# Patient Record
Sex: Male | Born: 1945 | Race: Black or African American | Hispanic: No | Marital: Single
Health system: Southern US, Community
[De-identification: ages and names within clinical notes are randomized; demographics above are authoritative.]

---

## 2015-11-01 ENCOUNTER — Encounter: Payer: Self-pay | Admitting: Sports Medicine

## 2015-11-01 ENCOUNTER — Ambulatory Visit (INDEPENDENT_AMBULATORY_CARE_PROVIDER_SITE_OTHER): Payer: Medicare Other | Admitting: Sports Medicine

## 2015-11-01 DIAGNOSIS — M79674 Pain in right toe(s): Secondary | ICD-10-CM | POA: Diagnosis not present

## 2015-11-01 DIAGNOSIS — B351 Tinea unguium: Secondary | ICD-10-CM

## 2015-11-01 DIAGNOSIS — B353 Tinea pedis: Secondary | ICD-10-CM | POA: Diagnosis not present

## 2015-11-01 DIAGNOSIS — M79675 Pain in left toe(s): Secondary | ICD-10-CM

## 2015-11-01 MED ORDER — ECONAZOLE NITRATE 1 % EX CREA: TOPICAL_CREAM | Freq: Every day | CUTANEOUS | 2 refills | Status: AC

## 2015-11-01 NOTE — Progress Notes (Signed)
Subjective: Harry Martin is a 70 y.o. male patient seen today in office with complaint of painful thickened and elongated toenails; unable to trim. Patient denies history of Diabetes, Neuropathy, or Vascular disease. Patient has no other pedal complaints at this time.   There are no active problems to display for this patient.   No current outpatient prescriptions on file prior to visit.   No current facility-administered medications on file prior to visit.     Not on File  Objective: Physical Exam  General: Well developed, nourished, no acute distress, awake, alert and oriented x 3  Vascular: Dorsalis pedis artery 2/4 bilateral, Posterior tibial artery 1/4 bilateral, skin temperature warm to warm proximal to distal bilateral lower extremities, no varicosities, pedal hair present bilateral.  Neurological: Gross sensation present via light touch bilateral.   Dermatological: Skin is warm, dry, and supple bilateral, Nails 1-10 are tender, long, thick, and discolored with mild subungal debris, no webspace macerations present bilateral, no open lesions present bilateral, no callus/corns/hyperkeratotic tissue present bilateral. Diffuse dry scaly skin plantarly suggestive of tinea. No signs of infection bilateral.  Musculoskeletal: No symptomatic boney deformities noted bilateral. Muscular strength within normal limits without painon range of motion. No pain with calf compression bilateral.  Assessment and Plan:  Problem List Items Addressed This Visit    None    Visit Diagnoses    Tinea pedis of both feet    -  Primary   Relevant Medications   econazole nitrate 1 % cream   Dermatophytosis of nail       Relevant Medications   econazole nitrate 1 % cream   Toe pain, bilateral         -Examined patient.  -Discussed treatment options for painful mycotic nails. -Mechanically debrided and reduced mycotic nails with sterile nail nipper and dremel nail file without incident. -Rx  Econazole cream to use daily  -Patient to return in 3 months for follow up evaluation or sooner if symptoms worsen.  Asencion Islamitorya Kirsti Mcalpine, DPM

## 2015-11-29 ENCOUNTER — Other Ambulatory Visit: Payer: Self-pay | Admitting: Neurology

## 2015-11-29 DIAGNOSIS — R410 Disorientation, unspecified: Secondary | ICD-10-CM

## 2015-11-29 DIAGNOSIS — R413 Other amnesia: Secondary | ICD-10-CM

## 2015-12-10 ENCOUNTER — Ambulatory Visit: Payer: Self-pay

## 2015-12-12 ENCOUNTER — Ambulatory Visit: Admission: RE | Admit: 2015-12-12 | Payer: Medicare Other | Source: Ambulatory Visit

## 2015-12-20 ENCOUNTER — Other Ambulatory Visit: Payer: Self-pay | Admitting: Neurology

## 2015-12-20 DIAGNOSIS — Z0189 Encounter for other specified special examinations: Secondary | ICD-10-CM

## 2015-12-20 DIAGNOSIS — Z1389 Encounter for screening for other disorder: Secondary | ICD-10-CM

## 2015-12-23 ENCOUNTER — Ambulatory Visit
Admission: RE | Admit: 2015-12-23 | Discharge: 2015-12-23 | Disposition: A | Discharge status: Home / Self Care | Payer: Medicare Other | Source: Ambulatory Visit | Attending: Neurology | Admitting: Neurology

## 2015-12-23 DIAGNOSIS — Z1389 Encounter for screening for other disorder: Secondary | ICD-10-CM

## 2015-12-23 DIAGNOSIS — M1610 Unilateral primary osteoarthritis, unspecified hip: Secondary | ICD-10-CM | POA: Insufficient documentation

## 2015-12-23 DIAGNOSIS — G44211 Episodic tension-type headache, intractable: Secondary | ICD-10-CM | POA: Diagnosis present

## 2015-12-23 DIAGNOSIS — M4802 Spinal stenosis, cervical region: Secondary | ICD-10-CM | POA: Insufficient documentation

## 2015-12-23 DIAGNOSIS — M47896 Other spondylosis, lumbar region: Secondary | ICD-10-CM | POA: Diagnosis not present

## 2015-12-23 DIAGNOSIS — R410 Disorientation, unspecified: Secondary | ICD-10-CM

## 2015-12-23 DIAGNOSIS — R413 Other amnesia: Secondary | ICD-10-CM | POA: Diagnosis not present

## 2016-02-07 ENCOUNTER — Ambulatory Visit: Payer: Medicare Other | Admitting: Podiatry

## 2016-02-11 ENCOUNTER — Ambulatory Visit (INDEPENDENT_AMBULATORY_CARE_PROVIDER_SITE_OTHER): Payer: Medicare Other | Admitting: Podiatry

## 2016-02-11 DIAGNOSIS — L608 Other nail disorders: Secondary | ICD-10-CM | POA: Diagnosis not present

## 2016-02-11 DIAGNOSIS — M7751 Other enthesopathy of right foot: Secondary | ICD-10-CM | POA: Diagnosis not present

## 2016-02-11 DIAGNOSIS — B351 Tinea unguium: Secondary | ICD-10-CM | POA: Diagnosis not present

## 2016-02-11 DIAGNOSIS — M7752 Other enthesopathy of left foot: Secondary | ICD-10-CM

## 2016-02-11 DIAGNOSIS — E0843 Diabetes mellitus due to underlying condition with diabetic autonomic (poly)neuropathy: Secondary | ICD-10-CM | POA: Diagnosis not present

## 2016-02-11 DIAGNOSIS — M25571 Pain in right ankle and joints of right foot: Secondary | ICD-10-CM | POA: Diagnosis not present

## 2016-02-11 DIAGNOSIS — M79609 Pain in unspecified limb: Secondary | ICD-10-CM | POA: Diagnosis not present

## 2016-02-11 DIAGNOSIS — M659 Synovitis and tenosynovitis, unspecified: Secondary | ICD-10-CM | POA: Diagnosis not present

## 2016-02-11 DIAGNOSIS — R600 Localized edema: Secondary | ICD-10-CM | POA: Diagnosis not present

## 2016-02-11 DIAGNOSIS — M25572 Pain in left ankle and joints of left foot: Secondary | ICD-10-CM

## 2016-02-11 DIAGNOSIS — L603 Nail dystrophy: Secondary | ICD-10-CM

## 2016-02-11 MED ORDER — CLOTRIMAZOLE-BETAMETHASONE 1-0.05 % EX CREA
1.0000 | TOPICAL_CREAM | Freq: Two times a day (BID) | CUTANEOUS | 1 refills | Status: AC
Start: 2016-02-11 — End: ?

## 2016-02-11 MED ORDER — CLOTRIMAZOLE-BETAMETHASONE 1-0.05 % EX CREA: 1.0000 "application " | TOPICAL_CREAM | Freq: Two times a day (BID) | CUTANEOUS | 1 refills | Status: DC

## 2016-02-11 MED ORDER — BETAMETHASONE SOD PHOS & ACET 6 (3-3) MG/ML IJ SUSP: 3.0000 mg | Freq: Once | INTRAMUSCULAR | Status: AC

## 2016-02-11 NOTE — Progress Notes (Signed)
Subjective:  Patient with a history of diabetes mellitus presents today for routine nail care. Patient states diffuse pain throughout the feet and ankles bilaterally. Patient presents today for further treatment and evaluation  Patient also presents today with a new complaint for pain and tenderness to the bilateral ankles. Patient relates significant pain and tenderness when walking.  Patient presents for further treatment and evaluation.   Objective / Physical Exam:  General:  The patient is alert and oriented x3 in no acute distress. Dermatology:  Hyperkeratotic, dystrophic nails noted 1 through 5 bilateral-painful. Skin is warm, dry and supple bilateral lower extremities. Negative for open lesions or macerations. Vascular:  Palpable pedal pulses bilaterally. No edema or erythema noted. Capillary refill within normal limits. Neurological:  Epicritic and protective threshold grossly intact bilaterally.  Musculoskeletal Exam:  Pain on palpation to the anterior lateral medial aspects of the patient's bilateral ankles. Mild edema noted.  Range of motion within normal limits to all pedal and ankle joints bilateral. Muscle strength 5/5 in all groups bilateral.   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 #2 injection of 0.5 mL Celestone Soluspan injected in the patient's bilateral ankle joints. #3 compression anklet dispensed to bilateral lower extremities #4 mechanical debridement of nails 1 through 5 bilateral was performed using a nail nipper without incident. #5 return to clinic in 3 months   Dr. Felecia ShellingBrent M. Evelena Masci, DPM [facility]

## 2016-02-27 ENCOUNTER — Telehealth: Payer: Self-pay | Admitting: *Deleted

## 2016-02-27 NOTE — Telephone Encounter (Addendum)
Care supervisor states pt was prescribed Lotrisone cream does he need to continue the econazole cream also. 02/28/2016-I spoke with Durward Mallardamille [facility] and gave orders to discontinue Econazole cream. She request orders to be faxed to [phone]. [facility] to [facility].

## 2016-02-28 NOTE — Telephone Encounter (Signed)
No. Discontinue econazole cream.

## 2016-05-18 ENCOUNTER — Ambulatory Visit (INDEPENDENT_AMBULATORY_CARE_PROVIDER_SITE_OTHER): Payer: Medicare Other | Admitting: Podiatry

## 2016-05-18 ENCOUNTER — Encounter: Payer: Self-pay | Admitting: Podiatry

## 2016-05-18 DIAGNOSIS — B351 Tinea unguium: Secondary | ICD-10-CM | POA: Diagnosis not present

## 2016-05-18 DIAGNOSIS — E0843 Diabetes mellitus due to underlying condition with diabetic autonomic (poly)neuropathy: Secondary | ICD-10-CM | POA: Diagnosis not present

## 2016-05-18 DIAGNOSIS — M79609 Pain in unspecified limb: Secondary | ICD-10-CM

## 2016-05-18 NOTE — Progress Notes (Signed)
Complaint:  Visit Type: Patient returns to my office for continued preventative foot care services. Complaint: Patient states" my nails have grown long and thick and become painful to walk and wear shoes" Patient has been diagnosed with DM with no foot complications. The patient presents for preventative foot care services. No changes to ROS  Podiatric Exam: Vascular: dorsalis pedis and posterior tibial pulses are palpable bilateral. Capillary return is immediate. Cold feet noted. Skin turgor WNL  Sensorium: Normal Semmes Weinstein monofilament test. Normal tactile sensation bilaterally. Nail Exam: Pt has thick disfigured discolored nails with subungual debris noted bilateral entire nail hallux through fifth toenails Ulcer Exam: There is no evidence of ulcer or pre-ulcerative changes or infection. Orthopedic Exam: Muscle tone and strength are WNL. No limitations in general ROM. No crepitus or effusions noted. Foot type and digits show no abnormalities. Bony prominences are unremarkable. Skin: No Porokeratosis. No infection or ulcers  Diagnosis:  Onychomycosis, , Pain in right toe, pain in left toes  Treatment & Plan Procedures and Treatment: Consent by patient was obtained for treatment procedures. The patient understood the discussion of treatment and procedures well. All questions were answered thoroughly reviewed. Debridement of mycotic and hypertrophic toenails, 1 through 5 bilateral and clearing of subungual debris. No ulceration, no infection noted.  Return Visit-Office Procedure: Patient instructed to return to the office for a follow up visit 3 months for continued evaluation and treatment.    Helane GuntherGregory Dell Hurtubise DPM

## 2016-08-24 ENCOUNTER — Ambulatory Visit: Payer: Medicare Other | Admitting: Podiatry

## 2016-08-31 ENCOUNTER — Other Ambulatory Visit: Payer: Self-pay | Admitting: Neurology

## 2016-08-31 DIAGNOSIS — R42 Dizziness and giddiness: Secondary | ICD-10-CM

## 2016-08-31 DIAGNOSIS — R55 Syncope and collapse: Secondary | ICD-10-CM

## 2016-09-04 ENCOUNTER — Ambulatory Visit: Payer: Medicare Other

## 2016-11-26 IMAGING — CR DG PELVIS 1-2V
1 series · 1 of 1 positions shown · non-contrast
Comparison: None.

CLINICAL DATA: Check for possible foreign body, MRI clearance

EXAM:
PELVIS - 1-2 VIEW

[dg pelvis 1-2 views]
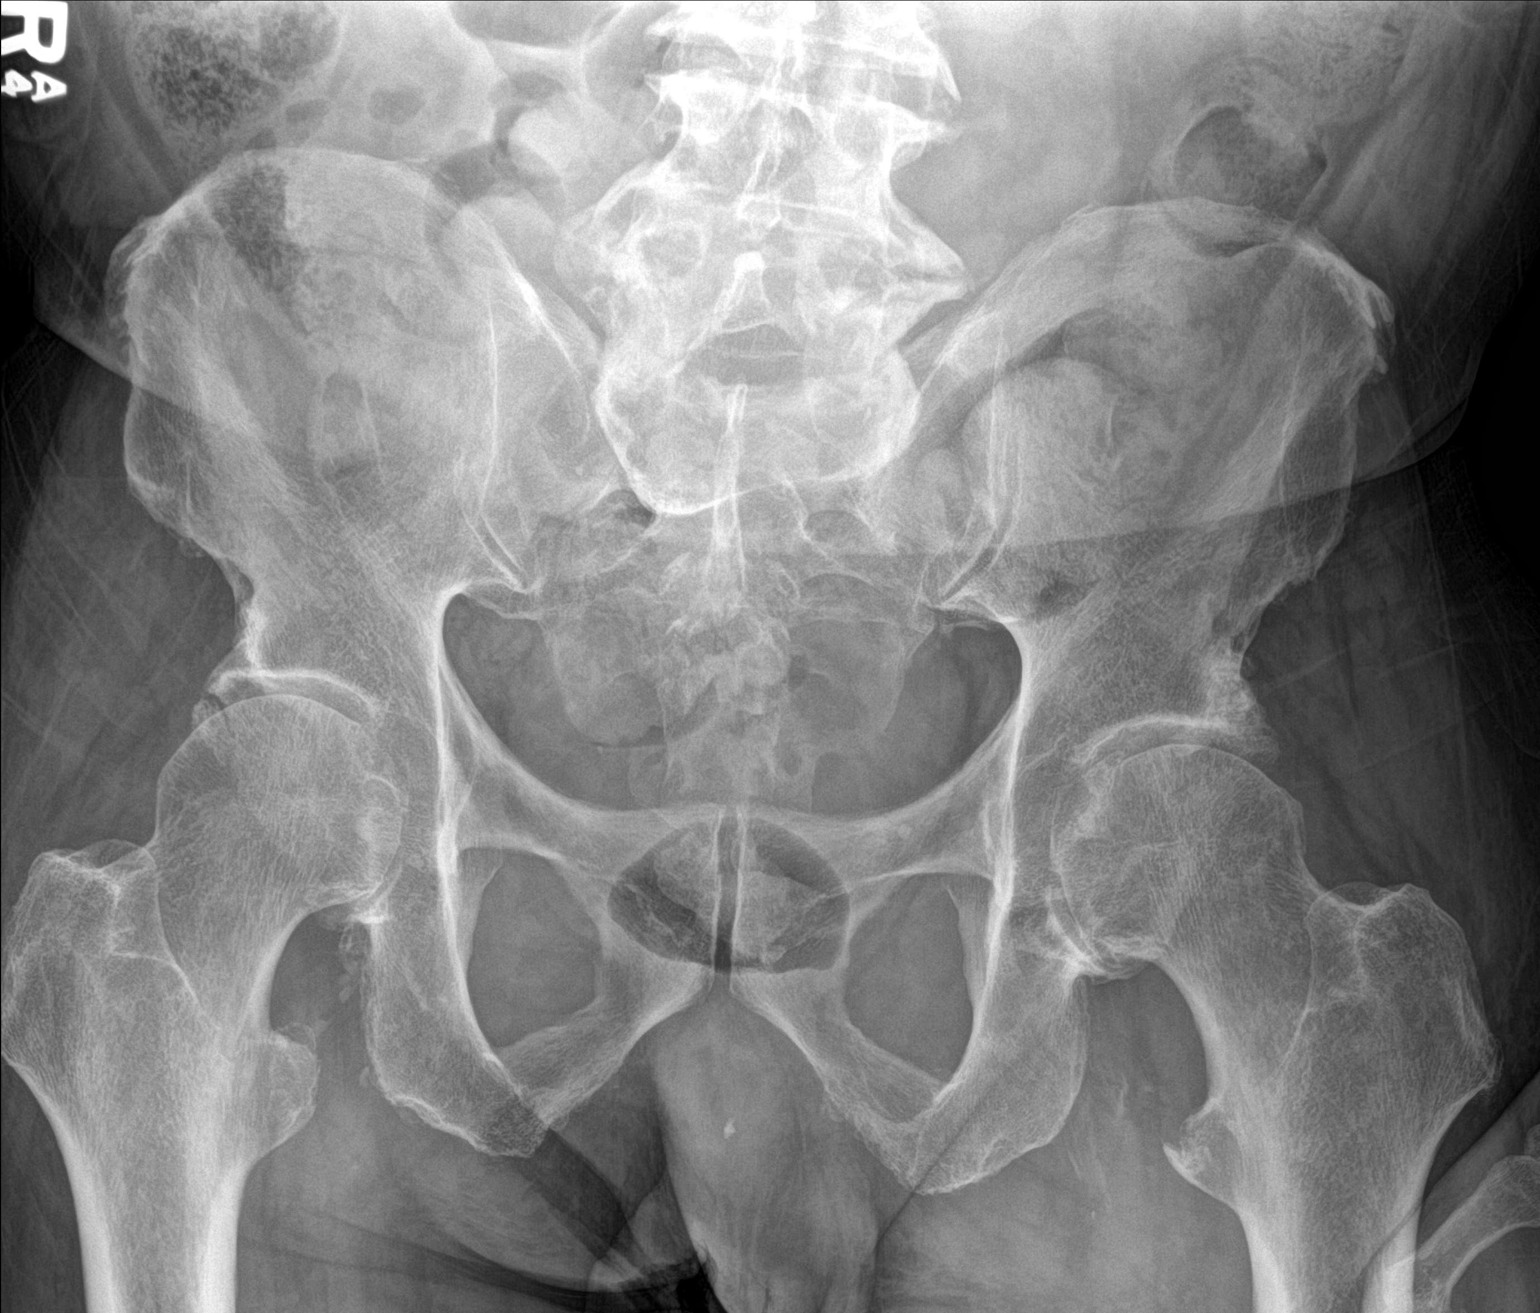

[1 of 1 positions shown; findings below may reference images not displayed]

FINDINGS: Degenerative changes of lumbar spine and hip joints are noted. No
radiopaque foreign body is seen. No gross soft tissue abnormality is
noted.
IMPRESSION: Degenerative change without acute abnormality.

## 2017-02-18 ENCOUNTER — Ambulatory Visit (INDEPENDENT_AMBULATORY_CARE_PROVIDER_SITE_OTHER): Payer: Medicare Other | Admitting: Podiatry

## 2017-02-18 ENCOUNTER — Encounter: Payer: Self-pay | Admitting: Podiatry

## 2017-02-18 DIAGNOSIS — M79609 Pain in unspecified limb: Secondary | ICD-10-CM

## 2017-02-18 DIAGNOSIS — B351 Tinea unguium: Secondary | ICD-10-CM | POA: Diagnosis not present

## 2017-02-18 NOTE — Progress Notes (Signed)
Complaint:  Visit Type: Patient returns to my office for continued preventative foot care services. Complaint: Patient states" my nails have grown long and thick and become painful to walk and wear shoes" Patient has been diagnosed with DM with no foot complications. The patient presents for preventative foot care services. No changes to ROS.  Patient has not been seen for 9 months.  Podiatric Exam: Vascular: dorsalis pedis and posterior tibial pulses are palpable bilateral. Capillary return is immediate. Cold feet noted. Skin turgor WNL  Sensorium: Normal Semmes Weinstein monofilament test. Normal tactile sensation bilaterally. Nail Exam: Pt has thick disfigured discolored nails with subungual debris noted bilateral entire nail hallux through fifth toenails Ulcer Exam: There is no evidence of ulcer or pre-ulcerative changes or infection. Orthopedic Exam: Muscle tone and strength are WNL. No limitations in general ROM. No crepitus or effusions noted. Foot type and digits show no abnormalities. Bony prominences are unremarkable. Skin: No Porokeratosis. No infection or ulcers  Diagnosis:  Onychomycosis, , Pain in right toe, pain in left toes  Treatment & Plan Procedures and Treatment: Consent by patient was obtained for treatment procedures. The patient understood the discussion of treatment and procedures well. All questions were answered thoroughly reviewed. Debridement of mycotic and hypertrophic toenails, 1 through 5 bilateral and clearing of subungual debris. No ulceration, no infection noted.  Return Visit-Office Procedure: Patient instructed to return to the office for a follow up visit 3 months for continued evaluation and treatment.    Helane GuntherGregory Ala Kratz DPM

## 2017-05-20 ENCOUNTER — Encounter: Payer: Self-pay | Admitting: Podiatry

## 2017-05-20 ENCOUNTER — Ambulatory Visit (INDEPENDENT_AMBULATORY_CARE_PROVIDER_SITE_OTHER): Payer: Medicare Other | Admitting: Podiatry

## 2017-05-20 DIAGNOSIS — B351 Tinea unguium: Secondary | ICD-10-CM | POA: Diagnosis not present

## 2017-05-20 DIAGNOSIS — M79676 Pain in unspecified toe(s): Secondary | ICD-10-CM | POA: Diagnosis not present

## 2017-05-20 DIAGNOSIS — M79609 Pain in unspecified limb: Principal | ICD-10-CM

## 2017-05-20 NOTE — Progress Notes (Signed)
Complaint:  Visit Type: Patient returns to my office for continued preventative foot care services. Complaint: Patient states" my nails have grown long and thick and become painful to walk and wear shoes" Patient has been diagnosed with DM with no foot complications. The patient presents for preventative foot care services. No changes to ROS.  Patient has not been seen for 9 months.  Podiatric Exam: Vascular: dorsalis pedis and posterior tibial pulses are palpable bilateral. Capillary return is immediate. Cold feet noted. Skin turgor WNL  Sensorium: Normal Semmes Weinstein monofilament test. Normal tactile sensation bilaterally. Nail Exam: Pt has thick disfigured discolored nails with subungual debris noted bilateral entire nail hallux through fifth toenails Ulcer Exam: There is no evidence of ulcer or pre-ulcerative changes or infection. Orthopedic Exam: Muscle tone and strength are WNL. No limitations in general ROM. No crepitus or effusions noted. Foot type and digits show no abnormalities. Bony prominences are unremarkable. Skin: No Porokeratosis. No infection or ulcers  Diagnosis:  Onychomycosis, , Pain in right toe, pain in left toes  Treatment & Plan Procedures and Treatment: Consent by patient was obtained for treatment procedures. The patient understood the discussion of treatment and procedures well. All questions were answered thoroughly reviewed. Debridement of mycotic and hypertrophic toenails, 1 through 5 bilateral and clearing of subungual debris. No ulceration, no infection noted. Medial aspect left hallux toenail self-avulsed.  Told guardian to watch left hallux nail. Return Visit-Office Procedure: Patient instructed to return to the office for a follow up visit 3 months for continued evaluation and treatment.    Harry GuntherGregory Norbert Malkin DPM

## 2017-08-19 ENCOUNTER — Ambulatory Visit: Payer: Medicare Other | Admitting: Podiatry

## 2018-09-21 DEATH — deceased
